# Patient Record
Sex: Female | Born: 1970 | Race: White | Marital: Married | Smoking: Never smoker
Health system: Southern US, Community
[De-identification: ages and names within clinical notes are randomized; demographics above are authoritative.]

---

## 2015-07-30 ENCOUNTER — Encounter: Payer: Self-pay | Admitting: Obstetrics and Gynecology

## 2015-07-30 ENCOUNTER — Ambulatory Visit (INDEPENDENT_AMBULATORY_CARE_PROVIDER_SITE_OTHER): Payer: BLUE CROSS/BLUE SHIELD | Admitting: Obstetrics and Gynecology

## 2015-07-30 VITALS — BP 118/70 | HR 80 | Resp 14 | Ht 63.0 in | Wt 129.0 lb

## 2015-07-30 DIAGNOSIS — Z124 Encounter for screening for malignant neoplasm of cervix: Secondary | ICD-10-CM

## 2015-07-30 DIAGNOSIS — Z01419 Encounter for gynecological examination (general) (routine) without abnormal findings: Secondary | ICD-10-CM | POA: Diagnosis not present

## 2015-07-30 DIAGNOSIS — Z83438 Family history of other disorder of lipoprotein metabolism and other lipidemia: Secondary | ICD-10-CM

## 2015-07-30 DIAGNOSIS — K649 Unspecified hemorrhoids: Secondary | ICD-10-CM

## 2015-07-30 DIAGNOSIS — Z13220 Encounter for screening for lipoid disorders: Secondary | ICD-10-CM | POA: Diagnosis not present

## 2015-07-30 DIAGNOSIS — Z8349 Family history of other endocrine, nutritional and metabolic diseases: Secondary | ICD-10-CM

## 2015-07-30 MED ORDER — HYDROCORTISONE 2.5 % RE CREA: 1.0000 "application " | TOPICAL_CREAM | Freq: Two times a day (BID) | RECTAL | Status: DC

## 2015-07-30 NOTE — Patient Instructions (Signed)
EXERCISE AND DIET:  We recommended that you start or continue a regular exercise program for good health. Regular exercise means any activity that makes your heart beat faster and makes you sweat.  We recommend exercising at least 30 minutes per day at least 3 days a week, preferably 4 or 5.  We also recommend a diet low in fat and sugar.  Inactivity, poor dietary choices and obesity can cause diabetes, heart attack, stroke, and kidney damage, among others.    ALCOHOL AND SMOKING:  Women should limit their alcohol intake to no more than 7 drinks/beers/glasses of wine (combined, not each!) per week. Moderation of alcohol intake to this level decreases your risk of breast cancer and liver damage. And of course, no recreational drugs are part of a healthy lifestyle.  And absolutely no smoking or even second hand smoke. Most people know smoking can cause heart and lung diseases, but did you know it also contributes to weakening of your bones? Aging of your skin?  Yellowing of your teeth and nails?  CALCIUM AND VITAMIN D:  Adequate intake of calcium and Vitamin D are recommended.  The recommendations for exact amounts of these supplements seem to change often, but generally speaking 600 mg of calcium (either carbonate or citrate) and 800 units of Vitamin D per day seems prudent. Certain women may benefit from higher intake of Vitamin D.  If you are among these women, your doctor will have told you during your visit.    PAP SMEARS:  Pap smears, to check for cervical cancer or precancers,  have traditionally been done yearly, although recent scientific advances have shown that most women can have pap smears less often.  However, every woman still should have a physical exam from her gynecologist every year. It will include a breast check, inspection of the vulva and vagina to check for abnormal growths or skin changes, a visual exam of the cervix, and then an exam to evaluate the size and shape of the uterus and  ovaries.  And after 44 years of age, a rectal exam is indicated to check for rectal cancers. We will also provide age appropriate advice regarding health maintenance, like when you should have certain vaccines, screening for sexually transmitted diseases, bone density testing, colonoscopy, mammograms, etc.   MAMMOGRAMS:  All women over 40 years old should have a yearly mammogram. Many facilities now offer a "3D" mammogram, which may cost around $50 extra out of pocket. If possible,  we recommend you accept the option to have the 3D mammogram performed.  It both reduces the number of women who will be called back for extra views which then turn out to be normal, and it is better than the routine mammogram at detecting truly abnormal areas.    COLONOSCOPY:  Colonoscopy to screen for colon cancer is recommended for all women at age 50.  We know, you hate the idea of the prep.  We agree, BUT, having colon cancer and not knowing it is worse!!  Colon cancer so often starts as a polyp that can be seen and removed at colonscopy, which can quite literally save your life!  And if your first colonoscopy is normal and you have no family history of colon cancer, most women don't have to have it again for 10 years.  Once every ten years, you can do something that may end up saving your life, right?  We will be happy to help you get it scheduled when you are ready.    Be sure to check your insurance coverage so you understand how much it will cost.  It may be covered as a preventative service at no cost, but you should check your particular policy.     Hemorrhoids Hemorrhoids are swollen veins around the rectum or anus. There are two types of hemorrhoids:   Internal hemorrhoids. These occur in the veins just inside the rectum. They may poke through to the outside and become irritated and painful.  External hemorrhoids. These occur in the veins outside the anus and can be felt as a painful swelling or hard lump near the  anus. CAUSES  Pregnancy.   Obesity.   Constipation or diarrhea.   Straining to have a bowel movement.   Sitting for long periods on the toilet.  Heavy lifting or other activity that caused you to strain.  Anal intercourse. SYMPTOMS   Pain.   Anal itching or irritation.   Rectal bleeding.   Fecal leakage.   Anal swelling.   One or more lumps around the anus.  DIAGNOSIS  Your caregiver may be able to diagnose hemorrhoids by visual examination. Other examinations or tests that may be performed include:   Examination of the rectal area with a gloved hand (digital rectal exam).   Examination of anal canal using a small tube (scope).   A blood test if you have lost a significant amount of blood.  A test to look inside the colon (sigmoidoscopy or colonoscopy). TREATMENT Most hemorrhoids can be treated at home. However, if symptoms do not seem to be getting better or if you have a lot of rectal bleeding, your caregiver may perform a procedure to help make the hemorrhoids get smaller or remove them completely. Possible treatments include:   Placing a rubber band at the base of the hemorrhoid to cut off the circulation (rubber band ligation).   Injecting a chemical to shrink the hemorrhoid (sclerotherapy).   Using a tool to burn the hemorrhoid (infrared light therapy).   Surgically removing the hemorrhoid (hemorrhoidectomy).   Stapling the hemorrhoid to block blood flow to the tissue (hemorrhoid stapling).  HOME CARE INSTRUCTIONS   Eat foods with fiber, such as whole grains, beans, nuts, fruits, and vegetables. Ask your doctor about taking products with added fiber in them (fibersupplements).  Increase fluid intake. Drink enough water and fluids to keep your urine clear or pale yellow.   Exercise regularly.   Go to the bathroom when you have the urge to have a bowel movement. Do not wait.   Avoid straining to have bowel movements.   Keep the  anal area dry and clean. Use wet toilet paper or moist towelettes after a bowel movement.   Medicated creams and suppositories may be used or applied as directed.   Only take over-the-counter or prescription medicines as directed by your caregiver.   Take warm sitz baths for 15-20 minutes, 3-4 times a day to ease pain and discomfort.   Place ice packs on the hemorrhoids if they are tender and swollen. Using ice packs between sitz baths may be helpful.   Put ice in a plastic bag.   Place a towel between your skin and the bag.   Leave the ice on for 15-20 minutes, 3-4 times a day.   Do not use a donut-shaped pillow or sit on the toilet for long periods. This increases blood pooling and pain.  SEEK MEDICAL CARE IF:  You have increasing pain and swelling that is not controlled by treatment   or medicine.  You have uncontrolled bleeding.  You have difficulty or you are unable to have a bowel movement.  You have pain or inflammation outside the area of the hemorrhoids. MAKE SURE YOU:  Understand these instructions.  Will watch your condition.  Will get help right away if you are not doing well or get worse.   This information is not intended to replace advice given to you by your health care provider. Make sure you discuss any questions you have with your health care provider.   Document Released: 10/01/2000 Document Revised: 09/20/2012 Document Reviewed: 08/08/2012 Elsevier Interactive Patient Education 2016 [employer]  

## 2015-07-30 NOTE — Progress Notes (Signed)
Patient ID: Brooke Stephens, female   DOB: 1971-08-20, 44 y.o.   MRN: [id number] 44 y.o. [id number] MarriedCaucasianF here for annual exam.  She had severe dizziness last week, lasted for a couple of days. Was in bed for 3 days. New to the area, needs a primary MD.  H/O hemorrhoids, flares up to 1 x a month, needs a script for anusol to use prn.  Period Cycle (Days): 28 Period Duration (Days): 3 days  Period Pattern: Regular Menstrual Flow: Moderate Menstrual Control: Tampon, Maxi pad, Thin pad Dysmenorrhea: None  Saturates a regular tampon in up to an hour for 1 day.   Patient's last menstrual period was 07/24/2015.          Sexually active: Yes.    The current method of family planning is vasectomy.  Exercising: Yes.    yoga and walking  Smoker:  no  Health Maintenance: Pap:  2014 WNL per patient History of abnormal Pap:  no MMG:  10/2013 WNL per Patient Colonoscopy:  1997- WNL per patient, h/o hemorrhoids BMD:   N/A TDaP:  unsure Gardasil: N/A   reports that she has never smoked. She has never used smokeless tobacco. She reports that she drinks about 1.2 - 1.8 oz of alcohol per week. She reports that she does not use illicit drugs. Kids are 15 and 12. Moved here for husbands work.   History reviewed. No pertinent past medical history.  History reviewed. No pertinent past surgical history.  No current outpatient prescriptions on file.   No current facility-administered medications for this visit.    Family History  Problem Relation Age of Onset  . Hyperlipidemia Mother   . Breast cancer Maternal Grandmother     Review of Systems  Constitutional: Negative.   HENT: Negative.   Eyes: Negative.   Respiratory: Negative.   Cardiovascular: Negative.   Gastrointestinal: Negative.   Endocrine: Negative.   Genitourinary: Negative.   Musculoskeletal: Negative.   Skin: Negative.   Allergic/Immunologic: Negative.   Neurological: Negative.   Psychiatric/Behavioral: Negative.      Exam:   BP 118/70 mmHg  Pulse 80  Resp 14  Ht  (1.6 m)  Wt 129 lb (58.514 kg)  BMI 22.86 kg/m2  LMP 07/24/2015  Weight change: @ Height:   Height:  (160 cm)  Ht Readings from Last 3 Encounters:  07/30/15  (1.6 m)    General appearance: alert, cooperative and appears stated age Head: Normocephalic, without obvious abnormality, atraumatic Neck: no adenopathy, supple, symmetrical, trachea midline and thyroid normal to inspection and palpation Lungs: clear to auscultation bilaterally Breasts: normal appearance, no masses or tenderness Heart: regular rate and rhythm Abdomen: soft, non-tender; bowel sounds normal; no masses,  no organomegaly Extremities: extremities normal, atraumatic, no cyanosis or edema Skin: Skin color, texture, turgor normal. No rashes or lesions Lymph nodes: Cervical, supraclavicular, and axillary nodes normal. No abnormal inguinal nodes palpated Neurologic: Grossly normal   Pelvic: External genitalia:  no lesions              Urethra:  normal appearing urethra with no masses, tenderness or lesions              Bartholins and Skenes: normal                 Vagina: normal appearing vagina with normal color and discharge, no lesions              Cervix: no lesions  Bimanual Exam:  Uterus:  normal size, contour, position, consistency, mobility, non-tender and anteverted              Adnexa: no mass, fullness, tenderness               Rectovaginal: Confirms               Anus:  normal sphincter tone, no lesions, no current external hemorrhoids   Chaperone was present for exam.  A:  Well Woman with normal exam  Hemorrhoids are intermittently bothersome  P:   Pap with hpv  Return for a fasting lipid profile  Mammogram due in 1/17  Anusol for prn use, not for daily use  Discussed BSE

## 2015-08-04 LAB — IPS PAP TEST WITH HPV

## 2015-08-13 ENCOUNTER — Other Ambulatory Visit: Payer: BLUE CROSS/BLUE SHIELD

## 2015-08-13 DIAGNOSIS — Z8349 Family history of other endocrine, nutritional and metabolic diseases: Secondary | ICD-10-CM

## 2015-08-13 DIAGNOSIS — Z83438 Family history of other disorder of lipoprotein metabolism and other lipidemia: Secondary | ICD-10-CM

## 2015-08-13 DIAGNOSIS — Z13220 Encounter for screening for lipoid disorders: Secondary | ICD-10-CM

## 2015-08-13 LAB — LIPID PANEL
CHOL/HDL RATIO: 3.1 ratio (ref <=5.0)
CHOLESTEROL: 200 mg/dL (ref 125–200)
HDL: 64 mg/dL (ref >=46)
LDL Cholesterol: 122 mg/dL (ref <130)
TRIGLYCERIDES: 68 mg/dL (ref <150)
VLDL: 14 mg/dL (ref <30)

## 2015-12-10 ENCOUNTER — Other Ambulatory Visit: Payer: Self-pay

## 2015-12-10 DIAGNOSIS — Z1231 Encounter for screening mammogram for malignant neoplasm of breast: Secondary | ICD-10-CM

## 2015-12-17 ENCOUNTER — Other Ambulatory Visit: Payer: Self-pay | Admitting: Obstetrics and Gynecology

## 2015-12-17 DIAGNOSIS — Z1231 Encounter for screening mammogram for malignant neoplasm of breast: Secondary | ICD-10-CM

## 2015-12-26 ENCOUNTER — Ambulatory Visit
Admission: RE | Admit: 2015-12-26 | Discharge: 2015-12-26 | Disposition: A | Discharge status: Home / Self Care | Payer: BLUE CROSS/BLUE SHIELD | Source: Ambulatory Visit | Attending: Obstetrics and Gynecology | Admitting: Obstetrics and Gynecology

## 2015-12-26 DIAGNOSIS — Z1231 Encounter for screening mammogram for malignant neoplasm of breast: Secondary | ICD-10-CM

## 2015-12-31 ENCOUNTER — Other Ambulatory Visit: Payer: Self-pay | Admitting: Obstetrics and Gynecology

## 2015-12-31 DIAGNOSIS — R928 Other abnormal and inconclusive findings on diagnostic imaging of breast: Secondary | ICD-10-CM

## 2016-01-07 ENCOUNTER — Ambulatory Visit
Admission: RE | Admit: 2016-01-07 | Discharge: 2016-01-07 | Disposition: A | Discharge status: Home / Self Care | Payer: BLUE CROSS/BLUE SHIELD | Source: Ambulatory Visit | Attending: Obstetrics and Gynecology | Admitting: Obstetrics and Gynecology

## 2016-01-07 DIAGNOSIS — R928 Other abnormal and inconclusive findings on diagnostic imaging of breast: Secondary | ICD-10-CM

## 2016-08-02 ENCOUNTER — Encounter: Payer: Self-pay | Admitting: Obstetrics and Gynecology

## 2016-08-02 ENCOUNTER — Ambulatory Visit (INDEPENDENT_AMBULATORY_CARE_PROVIDER_SITE_OTHER): Payer: BLUE CROSS/BLUE SHIELD | Admitting: Obstetrics and Gynecology

## 2016-08-02 VITALS — BP 108/60 | HR 80 | Resp 14 | Ht 63.0 in | Wt 130.0 lb

## 2016-08-02 DIAGNOSIS — Z01419 Encounter for gynecological examination (general) (routine) without abnormal findings: Secondary | ICD-10-CM

## 2016-08-02 DIAGNOSIS — Z23 Encounter for immunization: Secondary | ICD-10-CM

## 2016-08-02 DIAGNOSIS — Z0000 Encounter for general adult medical examination without abnormal findings: Secondary | ICD-10-CM

## 2016-08-02 LAB — CBC
HEMATOCRIT: 38.3 % (ref 35.0–45.0)
HEMOGLOBIN: 12.7 g/dL (ref 11.7–15.5)
MCH: 30.2 pg (ref 27.0–33.0)
MCHC: 33.2 g/dL (ref 32.0–36.0)
MCV: 91.2 fL (ref 80.0–100.0)
MPV: 10.9 fL (ref 7.5–12.5)
Platelets: 237 10*3/uL (ref 140–400)
RBC: 4.2 MIL/uL (ref 3.80–5.10)
RDW: 13.2 % (ref 11.0–15.0)
WBC: 6.5 10*3/uL (ref 3.8–10.8)

## 2016-08-02 MED ORDER — HYDROCORTISONE 2.5 % RE CREA: TOPICAL_CREAM | RECTAL | 1 refills | Status: AC

## 2016-08-02 NOTE — Patient Instructions (Signed)

## 2016-08-02 NOTE — Progress Notes (Signed)
45 y.o. [id number] MarriedCaucasianF here for annual exam.  No dyspareunia.  No hot flashes or night sweats.  Cycles are every 3 weeks, light. It has gotten lighter, closer and shorter.  Period Duration (Days): 1-2 days  Period Pattern: (!) Irregular Menstrual Flow: Light, Moderate Menstrual Control: Tampon, Thin pad Dysmenorrhea: None  She has hemorrhoids, flare about 1 x a month. Flares with constipation. Needs a refill on her anusol.   Patient's last menstrual period was 07/14/2016.          Sexually active: Yes.    The current method of family planning is vasectomy.    Exercising: Yes.    yoga/ walking Smoker:  no  Health Maintenance: Pap:  07-30-15 WNL NEG HR HPV History of abnormal Pap:  no MMG:  01-07-16 WNL  Colonoscopy:  Years ago- NORMAL BMD:   Never TDaP:  unsure Gardasil: N/A   reports that she has never smoked. She has never used smokeless tobacco. She reports that she drinks about 1.2 - 1.8 oz of alcohol per week . She reports that she does not use drugs.  Kids are 16 and 13. She is working in [employer] (loves it).   No past medical history on file.  No past surgical history on file.  Current Outpatient Prescriptions  Medication Sig Dispense Refill  . hydrocortisone (ANUSOL-HC) 2.5 % rectal cream Place 1 application rectally 2 (two) times daily. 30 g 1   No current facility-administered medications for this visit.     Family History  Problem Relation Age of Onset  . Hyperlipidemia Mother   . Breast cancer Maternal Grandmother     Review of Systems  Constitutional: Positive for unexpected weight change.       Weight gain  Eyes: Negative.   Respiratory: Negative.   Cardiovascular: Negative.   Gastrointestinal: Negative.   Endocrine: Negative.   Genitourinary: Positive for menstrual problem.       Irregular menstrual cycles Loss of urine with sneezing or coughing  Musculoskeletal: Negative.   Skin: Negative.   Allergic/Immunologic: Negative.    Neurological: Positive for headaches.  Hematological: Bruises/bleeds easily.  Psychiatric/Behavioral: Negative.     Exam:   BP 108/60 (BP Location: Right Arm, Patient Position: Sitting, Cuff Size: Normal)   Pulse 80   Resp 14   Ht 5\' 3"  (1.6 m)   Wt 130 lb (59 kg)   LMP 07/14/2016   BMI 23.03 kg/m   Weight change: @WEIGHTCHANGE @ Height:   Height: 5\' 3"  (160 cm)  Ht Readings from Last 3 Encounters:  08/02/16 5\' 3"  (1.6 m)  07/30/15 5\' 3"  (1.6 m)    General appearance: alert, cooperative and appears stated age Head: Normocephalic, without obvious abnormality, atraumatic Neck: no adenopathy, supple, symmetrical, trachea midline and thyroid normal to inspection and palpation Lungs: clear to auscultation bilaterally Breasts: normal appearance, no masses or tenderness Heart: regular rate and rhythm Abdomen: soft, non-tender; bowel sounds normal; no masses,  no organomegaly Extremities: extremities normal, atraumatic, no cyanosis or edema Skin: Skin color, texture, turgor normal. No rashes or lesions Lymph nodes: Cervical, supraclavicular, and axillary nodes normal. No abnormal inguinal nodes palpated Neurologic: Grossly normal   Pelvic: External genitalia:  no lesions              Urethra:  normal appearing urethra with no masses, tenderness or lesions              Bartholins and Skenes: normal  Vagina: normal appearing vagina with normal color and discharge, no lesions              Cervix: no lesions               Bimanual Exam:  Uterus:  normal size, contour, position, consistency, mobility, non-tender, anteverted              Adnexa: no mass, fullness, tenderness               Rectovaginal: Confirms               Anus:  normal sphincter tone, no lesions  Chaperone was present for exam.  A:  Well Woman with normal exam  Hemorrhoids with intermittent flares  P:   Lipids were normal last year.   CBC, CMP, vit D   No pap this year  Mammogram is  UTD  Discussed breast self exam  Discussed calcium and vit D intake  Anusol for prn use

## 2016-08-03 LAB — COMPREHENSIVE METABOLIC PANEL
ALBUMIN: 4.1 g/dL (ref 3.6–5.1)
ALK PHOS: 41 U/L (ref 33–115)
ALT: 11 U/L (ref 6–29)
AST: 17 U/L (ref 10–35)
BILIRUBIN TOTAL: 0.4 mg/dL (ref 0.2–1.2)
BUN: 13 mg/dL (ref 7–25)
CALCIUM: 9.2 mg/dL (ref 8.6–10.2)
CO2: 24 mmol/L (ref 20–31)
Chloride: 102 mmol/L (ref 98–110)
Creat: 0.58 mg/dL (ref 0.50–1.10)
GLUCOSE: 91 mg/dL (ref 65–99)
POTASSIUM: 4.2 mmol/L (ref 3.5–5.3)
Sodium: 139 mmol/L (ref 135–146)
TOTAL PROTEIN: 7 g/dL (ref 6.1–8.1)

## 2016-08-03 LAB — VITAMIN D 25 HYDROXY (VIT D DEFICIENCY, FRACTURES): VIT D 25 HYDROXY: 49 ng/mL (ref 30–100)

## 2017-08-04 ENCOUNTER — Ambulatory Visit: Payer: BLUE CROSS/BLUE SHIELD | Admitting: Obstetrics and Gynecology

## 2017-09-07 ENCOUNTER — Other Ambulatory Visit: Payer: Self-pay | Admitting: Family Medicine

## 2017-09-07 DIAGNOSIS — Z139 Encounter for screening, unspecified: Secondary | ICD-10-CM

## 2017-10-14 ENCOUNTER — Ambulatory Visit
Admission: RE | Admit: 2017-10-14 | Discharge: 2017-10-14 | Disposition: A | Discharge status: Home / Self Care | Payer: BLUE CROSS/BLUE SHIELD | Source: Ambulatory Visit | Attending: Family Medicine | Admitting: Family Medicine

## 2017-10-14 DIAGNOSIS — Z139 Encounter for screening, unspecified: Secondary | ICD-10-CM

## 2018-07-17 ENCOUNTER — Other Ambulatory Visit: Payer: Self-pay | Admitting: Obstetrics and Gynecology

## 2018-09-26 ENCOUNTER — Other Ambulatory Visit: Payer: Self-pay | Admitting: Family Medicine

## 2018-09-26 DIAGNOSIS — Z1231 Encounter for screening mammogram for malignant neoplasm of breast: Secondary | ICD-10-CM

## 2018-11-02 ENCOUNTER — Other Ambulatory Visit: Payer: Self-pay | Admitting: Family Medicine

## 2018-11-02 ENCOUNTER — Ambulatory Visit
Admission: RE | Admit: 2018-11-02 | Discharge: 2018-11-02 | Disposition: A | Discharge status: Home / Self Care | Payer: BLUE CROSS/BLUE SHIELD | Source: Ambulatory Visit | Attending: Family Medicine | Admitting: Family Medicine

## 2018-11-02 DIAGNOSIS — Z1231 Encounter for screening mammogram for malignant neoplasm of breast: Secondary | ICD-10-CM

## 2019-10-15 ENCOUNTER — Other Ambulatory Visit: Payer: Self-pay | Admitting: Physician Assistant

## 2019-10-15 DIAGNOSIS — Z1231 Encounter for screening mammogram for malignant neoplasm of breast: Secondary | ICD-10-CM

## 2019-11-28 ENCOUNTER — Other Ambulatory Visit: Payer: Self-pay

## 2019-11-28 ENCOUNTER — Ambulatory Visit
Admission: RE | Admit: 2019-11-28 | Discharge: 2019-11-28 | Disposition: A | Discharge status: Home / Self Care | Payer: Managed Care, Other (non HMO) | Source: Ambulatory Visit | Attending: Physician Assistant | Admitting: Physician Assistant

## 2019-11-28 DIAGNOSIS — Z1231 Encounter for screening mammogram for malignant neoplasm of breast: Secondary | ICD-10-CM

## 2020-11-11 ENCOUNTER — Other Ambulatory Visit: Payer: Self-pay | Admitting: Physician Assistant

## 2020-11-11 DIAGNOSIS — Z1231 Encounter for screening mammogram for malignant neoplasm of breast: Secondary | ICD-10-CM

## 2020-12-24 ENCOUNTER — Ambulatory Visit: Payer: Managed Care, Other (non HMO)

## 2021-01-27 ENCOUNTER — Other Ambulatory Visit: Payer: Self-pay

## 2021-01-27 ENCOUNTER — Ambulatory Visit
Admission: RE | Admit: 2021-01-27 | Discharge: 2021-01-27 | Disposition: A | Discharge status: Home / Self Care | Payer: PRIVATE HEALTH INSURANCE | Source: Ambulatory Visit | Attending: Physician Assistant | Admitting: Physician Assistant

## 2021-01-27 DIAGNOSIS — Z1231 Encounter for screening mammogram for malignant neoplasm of breast: Secondary | ICD-10-CM

## 2021-01-29 ENCOUNTER — Other Ambulatory Visit: Payer: Self-pay | Admitting: Physician Assistant

## 2021-01-29 DIAGNOSIS — R928 Other abnormal and inconclusive findings on diagnostic imaging of breast: Secondary | ICD-10-CM

## 2021-02-18 ENCOUNTER — Ambulatory Visit: Payer: PRIVATE HEALTH INSURANCE

## 2021-02-18 ENCOUNTER — Ambulatory Visit
Admission: RE | Admit: 2021-02-18 | Discharge: 2021-02-18 | Disposition: A | Discharge status: Home / Self Care | Payer: PRIVATE HEALTH INSURANCE | Source: Ambulatory Visit | Attending: Physician Assistant | Admitting: Physician Assistant

## 2021-02-18 ENCOUNTER — Other Ambulatory Visit: Payer: Self-pay

## 2021-02-18 DIAGNOSIS — R928 Other abnormal and inconclusive findings on diagnostic imaging of breast: Secondary | ICD-10-CM

## 2022-01-11 ENCOUNTER — Other Ambulatory Visit: Payer: Self-pay | Admitting: Physician Assistant

## 2022-01-11 DIAGNOSIS — Z1231 Encounter for screening mammogram for malignant neoplasm of breast: Secondary | ICD-10-CM

## 2022-02-22 ENCOUNTER — Other Ambulatory Visit: Payer: Self-pay | Admitting: Physician Assistant

## 2022-02-22 DIAGNOSIS — Z1231 Encounter for screening mammogram for malignant neoplasm of breast: Secondary | ICD-10-CM

## 2022-02-24 ENCOUNTER — Ambulatory Visit
Admission: RE | Admit: 2022-02-24 | Discharge: 2022-02-24 | Disposition: A | Discharge status: Home / Self Care | Payer: BC Managed Care – PPO | Source: Ambulatory Visit

## 2022-02-24 DIAGNOSIS — Z1231 Encounter for screening mammogram for malignant neoplasm of breast: Secondary | ICD-10-CM

## 2022-12-07 ENCOUNTER — Other Ambulatory Visit: Payer: Self-pay | Admitting: Physician Assistant

## 2022-12-07 DIAGNOSIS — Z1231 Encounter for screening mammogram for malignant neoplasm of breast: Secondary | ICD-10-CM

## 2023-03-01 ENCOUNTER — Ambulatory Visit: Payer: BC Managed Care – PPO

## 2023-04-28 IMAGING — MG MM DIGITAL DIAGNOSTIC UNILAT*L* W/ TOMO W/ CAD
4 series · 4 of 12 positions shown · non-contrast
Comparison: Previous exam(s).

CLINICAL DATA: Recall from screening mammography, possible one-view
asymmetry in the UPPER LEFT breast visible only on the MLO view.

EXAM:
DIGITAL DIAGNOSTIC UNILATERAL LEFT MAMMOGRAM WITH TOMOSYNTHESIS AND
CAD
TECHNIQUE: Left digital diagnostic mammography and breast tomosynthesis was
performed. The images were evaluated with computer-aided detection.

[L ML synth-2D]
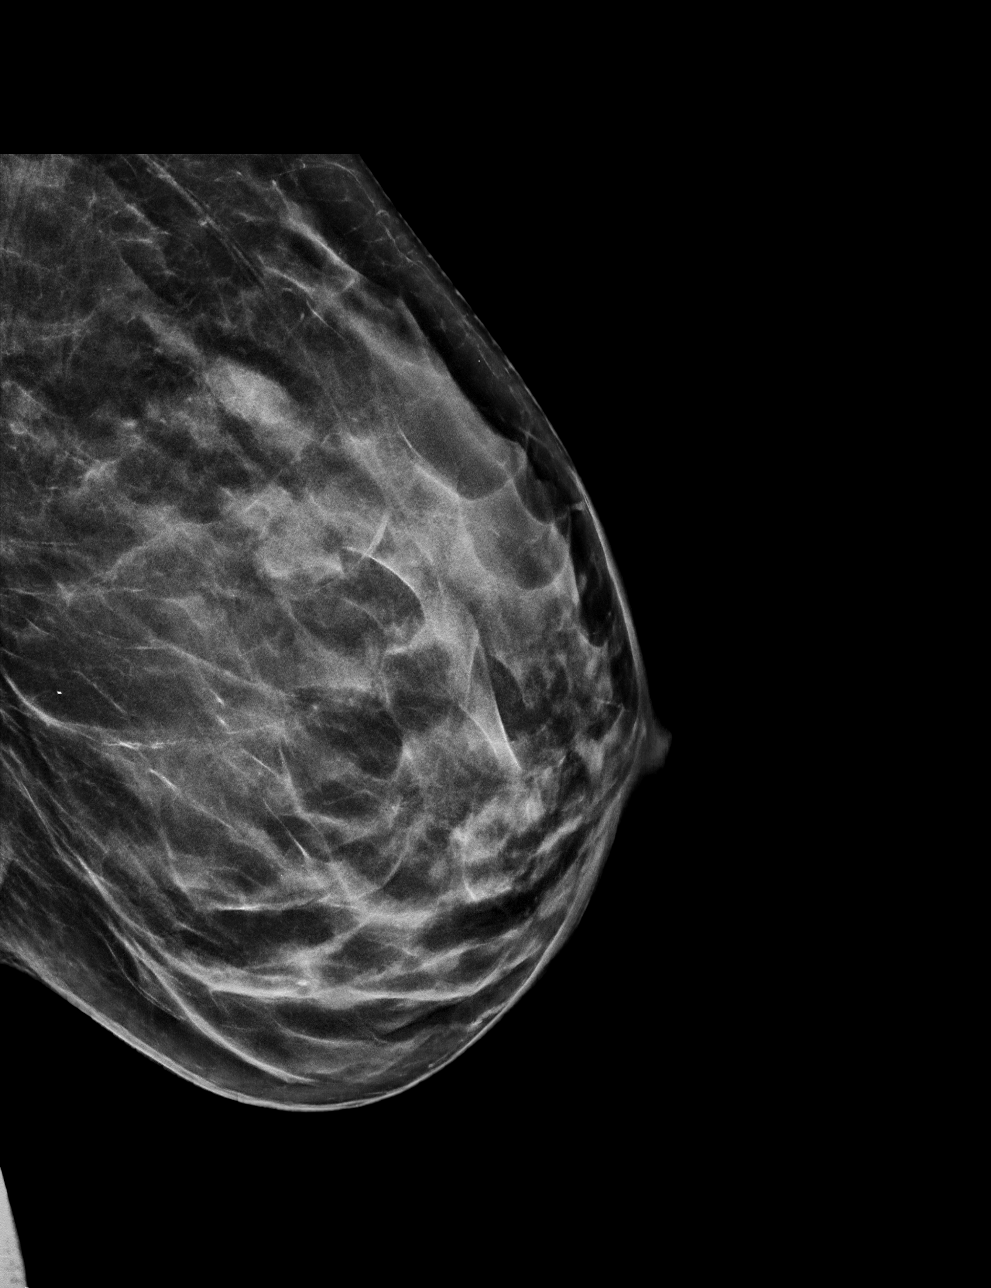

[L MLO synth-2D]
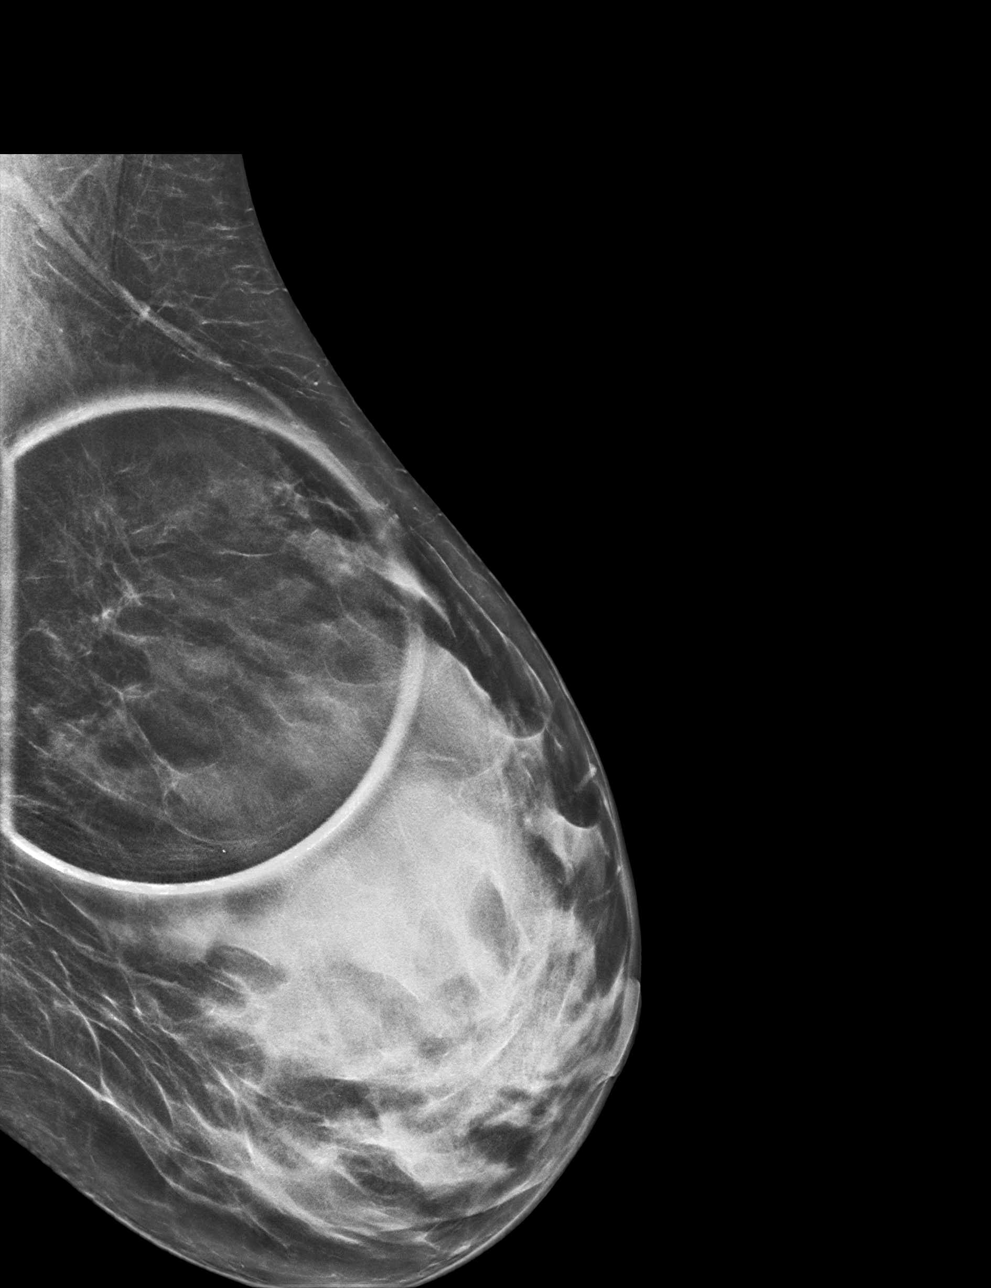

[L ML tomo · tomo slice 29/56.0]
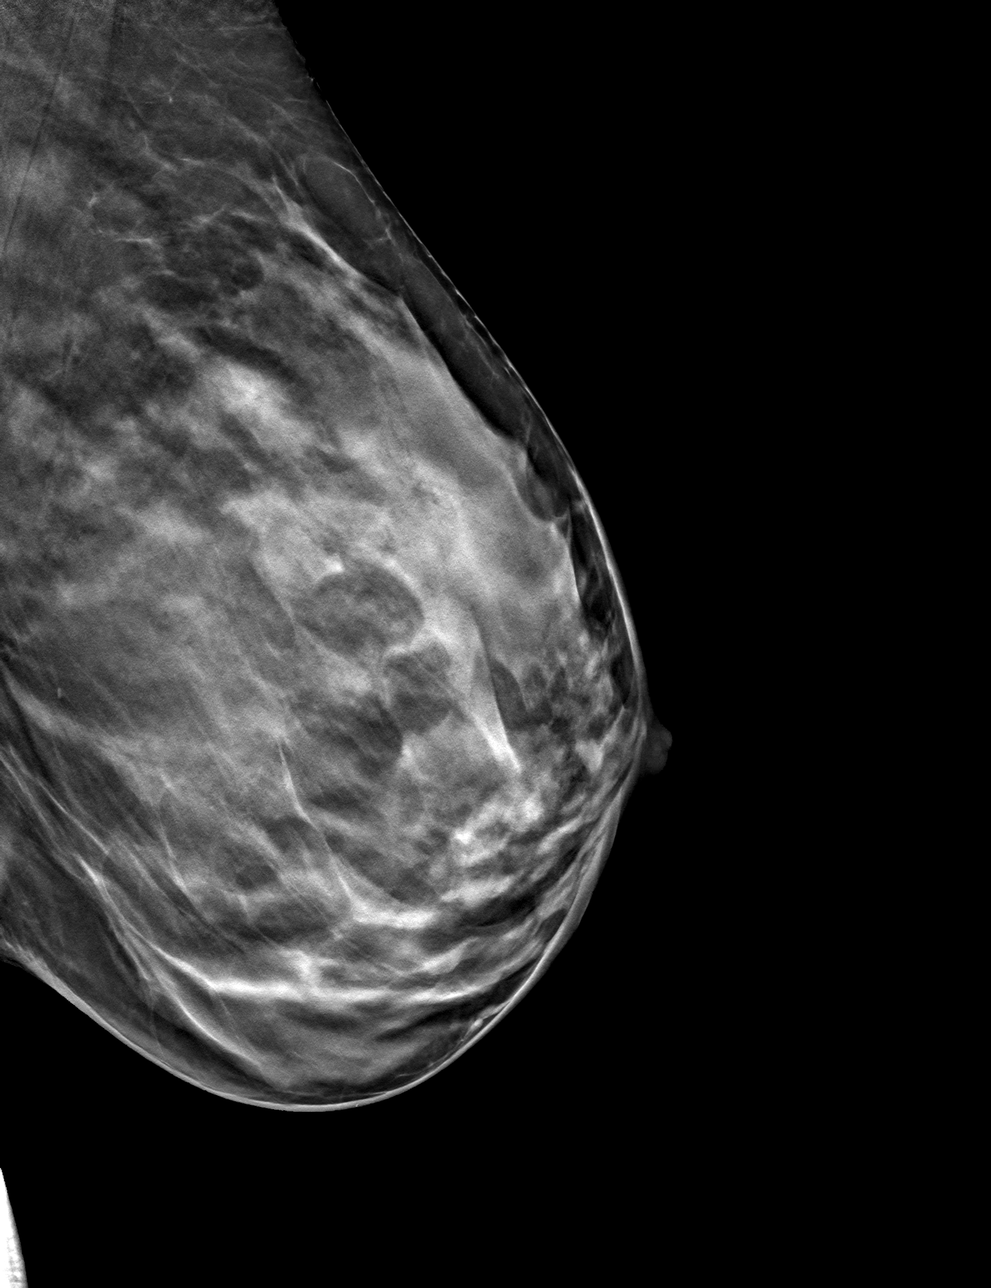

[L MLO tomo · tomo slice 31/61.0]
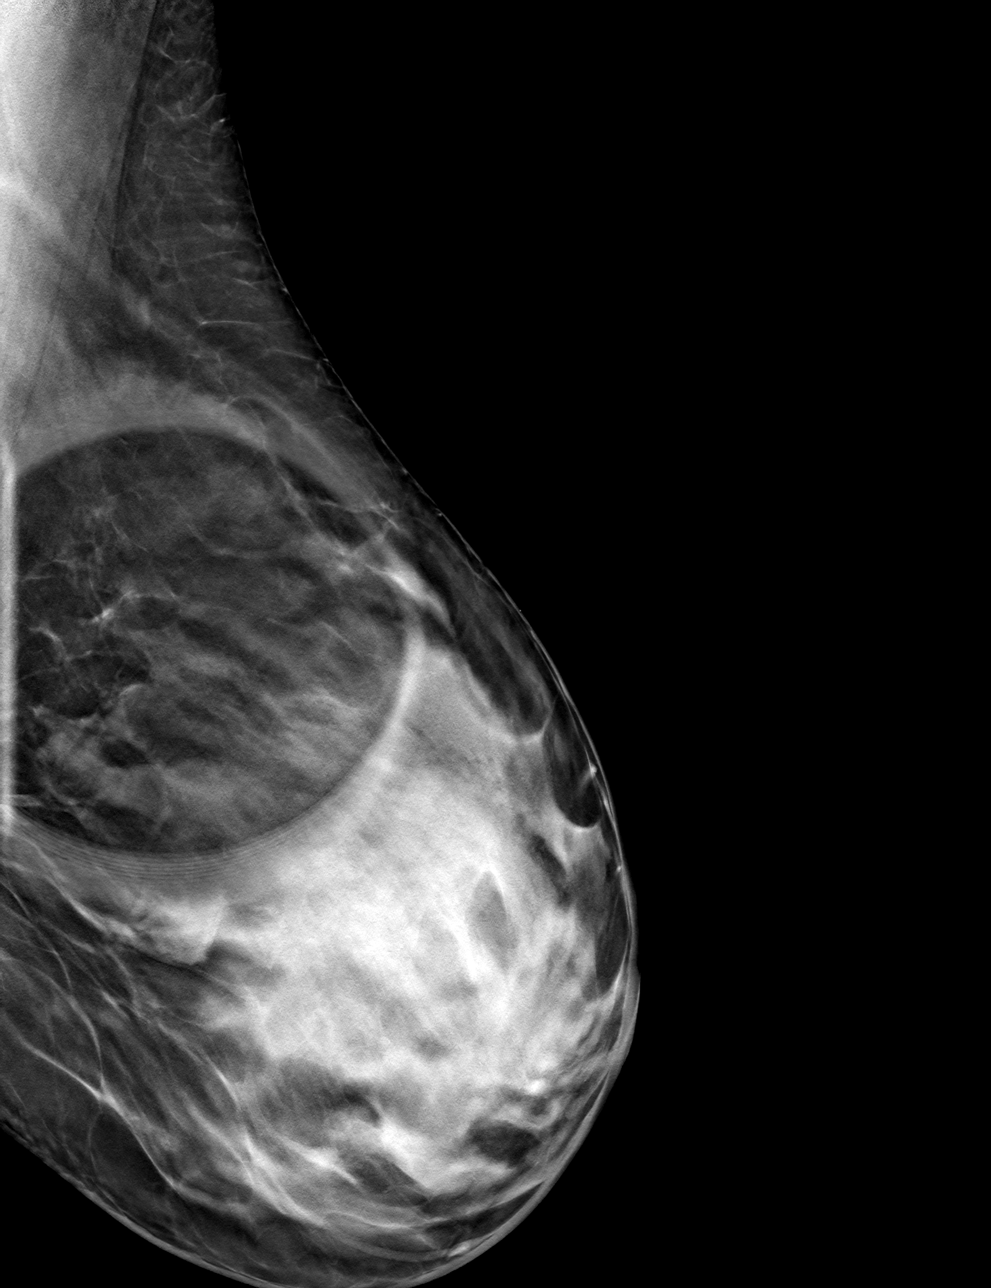

[4 of 12 positions shown; findings below may reference images not displayed]

ACR Breast Density Category d: The breast tissue is extremely dense,
which lowers the sensitivity of mammography.
FINDINGS: Spot-compression MLO view of the area of concern and a full field
mediolateral view were obtained.

The asymmetry questioned on screening mammography in the upper
breast disperses with compression, indicating overlapping
fibroglandular tissue. There is no underlying mass or architectural
distortion.

No findings suspicious for malignancy.
IMPRESSION: No mammographic evidence of malignancy involving the LEFT breast.

RECOMMENDATION:
Screening mammogram in one year.(Code:[id number])

I have discussed the findings and recommendations with the patient.
If applicable, a reminder letter will be sent to the patient
regarding the next appointment.

BI-RADS CATEGORY  1: Negative.
